# Patient Record
Sex: Male | Born: 1970 | Race: Black or African American | Hispanic: No | Marital: Married | Smoking: Current every day smoker
Health system: Southern US, Community
[De-identification: ages and names within clinical notes are randomized; demographics above are authoritative.]

## PROBLEM LIST (undated history)

## (undated) DIAGNOSIS — I10 Essential (primary) hypertension: Secondary | ICD-10-CM

---

## 2005-02-15 ENCOUNTER — Emergency Department (HOSPITAL_COMMUNITY): Admission: EM | Admit: 2005-02-15 | Discharge: 2005-02-15 | Payer: Self-pay | Admitting: Emergency Medicine

## 2005-02-15 ENCOUNTER — Emergency Department (HOSPITAL_COMMUNITY): Admission: EM | Admit: 2005-02-15 | Discharge: 2005-02-16 | Payer: Self-pay | Admitting: Emergency Medicine

## 2008-06-23 ENCOUNTER — Emergency Department (HOSPITAL_COMMUNITY): Admission: EM | Admit: 2008-06-23 | Discharge: 2008-06-23 | Payer: Self-pay | Admitting: Emergency Medicine

## 2008-08-25 ENCOUNTER — Emergency Department (HOSPITAL_COMMUNITY): Admission: EM | Admit: 2008-08-25 | Discharge: 2008-08-25 | Payer: Self-pay | Admitting: Emergency Medicine

## 2010-11-30 ENCOUNTER — Inpatient Hospital Stay (INDEPENDENT_AMBULATORY_CARE_PROVIDER_SITE_OTHER)
Admission: RE | Admit: 2010-11-30 | Discharge: 2010-11-30 | Disposition: A | Discharge status: Home / Self Care | Payer: Medicare Other | Source: Ambulatory Visit | Attending: Emergency Medicine | Admitting: Emergency Medicine

## 2010-11-30 DIAGNOSIS — R350 Frequency of micturition: Secondary | ICD-10-CM

## 2010-11-30 DIAGNOSIS — L259 Unspecified contact dermatitis, unspecified cause: Secondary | ICD-10-CM

## 2010-11-30 LAB — POCT URINALYSIS DIP (DEVICE)
Hgb urine dipstick: NEGATIVE
Nitrite: NEGATIVE
Protein, ur: NEGATIVE mg/dL
Specific Gravity, Urine: 1.02 (ref 1.005–1.030)
Urobilinogen, UA: 0.2 mg/dL (ref 0.0–1.0)
pH: 6.5 (ref 5.0–8.0)

## 2010-12-31 ENCOUNTER — Emergency Department (HOSPITAL_COMMUNITY)
Admission: EM | Admit: 2010-12-31 | Discharge: 2010-12-31 | Disposition: A | Discharge status: Home / Self Care | Payer: Medicare Other | Attending: Emergency Medicine | Admitting: Emergency Medicine

## 2010-12-31 DIAGNOSIS — R1013 Epigastric pain: Secondary | ICD-10-CM | POA: Insufficient documentation

## 2010-12-31 DIAGNOSIS — Z8659 Personal history of other mental and behavioral disorders: Secondary | ICD-10-CM | POA: Insufficient documentation

## 2010-12-31 DIAGNOSIS — K219 Gastro-esophageal reflux disease without esophagitis: Secondary | ICD-10-CM | POA: Insufficient documentation

## 2010-12-31 LAB — URINALYSIS, ROUTINE W REFLEX MICROSCOPIC
Bilirubin Urine: NEGATIVE
Glucose, UA: NEGATIVE mg/dL
Hgb urine dipstick: NEGATIVE
Ketones, ur: NEGATIVE mg/dL
Protein, ur: NEGATIVE mg/dL
pH: 6.5 (ref 5.0–8.0)

## 2011-04-20 ENCOUNTER — Other Ambulatory Visit (HOSPITAL_COMMUNITY): Payer: Self-pay | Admitting: Internal Medicine

## 2011-04-20 DIAGNOSIS — R1012 Left upper quadrant pain: Secondary | ICD-10-CM

## 2011-04-20 DIAGNOSIS — R1032 Left lower quadrant pain: Secondary | ICD-10-CM

## 2011-04-22 ENCOUNTER — Other Ambulatory Visit (HOSPITAL_COMMUNITY): Payer: Medicare Other

## 2011-04-24 ENCOUNTER — Ambulatory Visit (HOSPITAL_COMMUNITY)
Admission: RE | Admit: 2011-04-24 | Discharge: 2011-04-24 | Disposition: A | Discharge status: Home / Self Care | Payer: Medicare Other | Source: Ambulatory Visit | Attending: Internal Medicine | Admitting: Internal Medicine

## 2011-04-24 DIAGNOSIS — R1032 Left lower quadrant pain: Secondary | ICD-10-CM

## 2011-04-24 DIAGNOSIS — R1012 Left upper quadrant pain: Secondary | ICD-10-CM | POA: Insufficient documentation

## 2011-04-24 DIAGNOSIS — K824 Cholesterolosis of gallbladder: Secondary | ICD-10-CM | POA: Insufficient documentation

## 2011-12-02 ENCOUNTER — Encounter (HOSPITAL_COMMUNITY): Payer: Self-pay | Admitting: *Deleted

## 2011-12-02 ENCOUNTER — Emergency Department (HOSPITAL_COMMUNITY)
Admission: EM | Admit: 2011-12-02 | Discharge: 2011-12-02 | Disposition: A | Discharge status: Home / Self Care | Payer: Medicare Other | Attending: Emergency Medicine | Admitting: Emergency Medicine

## 2011-12-02 DIAGNOSIS — S51009A Unspecified open wound of unspecified elbow, initial encounter: Secondary | ICD-10-CM | POA: Insufficient documentation

## 2011-12-02 DIAGNOSIS — F17200 Nicotine dependence, unspecified, uncomplicated: Secondary | ICD-10-CM | POA: Insufficient documentation

## 2011-12-02 DIAGNOSIS — IMO0002 Reserved for concepts with insufficient information to code with codable children: Secondary | ICD-10-CM

## 2011-12-02 DIAGNOSIS — X58XXXA Exposure to other specified factors, initial encounter: Secondary | ICD-10-CM | POA: Insufficient documentation

## 2011-12-02 MED ORDER — TETANUS-DIPHTH-ACELL PERTUSSIS 5-2.5-18.5 LF-MCG/0.5 IM SUSP
0.5000 mL | Freq: Once | INTRAMUSCULAR | Status: AC
  Administered 2011-12-02: 0.5 mL via INTRAMUSCULAR
  Filled 2011-12-02: qty 0.5

## 2011-12-02 NOTE — Discharge Instructions (Signed)
Laceration Care, Adult A laceration is a cut that goes through all layers of the skin. The cut goes into the tissue beneath the skin. HOME CARE For skin adhesive strips:  Keep the cut clean and dry.   Do not get the strips wet. You may take a bath, but be careful to keep the cut dry.   If the cut gets wet, pat it dry with a clean towel.   The strips will fall off on their own. Do not remove the strips that are still stuck to the cut.    You may need a tetanus shot if:  You cannot remember when you had your last tetanus shot.   You have never had a tetanus shot.  If you need a tetanus shot and you choose not to have one, you may get tetanus. Sickness from tetanus can be serious. GET HELP RIGHT AWAY IF:   Your pain does not get better with medicine.   Your arm, hand, leg, or foot loses feeling (numbness) or changes color.   Your cut is bleeding.   Your joint feels weak, or you cannot use your joint.   You have painful lumps on your body.   Your cut is red, puffy (swollen), or painful.   You have a red line on the skin near the cut.   You have yellowish-white fluid (pus) coming from the cut.   You have a fever.   You have a bad smell coming from the cut or bandage.   Your cut breaks open before or after stitches are removed.   You notice something coming out of the cut, such as wood or glass.   You cannot move a finger or toe.  MAKE SURE YOU:   Understand these instructions.   Will watch your condition.   Will get help right away if you are not doing well or get worse.  Document Released: 01/13/2008 Document Revised: 07/16/2011 Document Reviewed: 01/20/2011 [facility] Patient Information 2012 [place], [place].

## 2011-12-02 NOTE — ED Provider Notes (Signed)
History     CSN: [id number]  Arrival date & time 12/02/11  4098   First MD Initiated Contact with Patient 12/02/11 1037      Chief Complaint  Patient presents with  . Extremity Laceration    (Consider location/radiation/quality/duration/timing/severity/associated sxs/prior treatment) HPI Comments: Patient presents to the emergency department with a chief complaint of right elbow laceration.  Incident occurred 2 days ago.  Patient is not up-to-date on his tetanus.  Patient denies any fevers, night sweats, chills, streaking up the arm, erythema or purulent discharge from laceration.  He denies any pain with flexion or extension of the low.  Laceration is currently not bleeding.  No other complaints at this time.  The history is provided by the patient.    History reviewed. No pertinent past medical history.  History reviewed. No pertinent past surgical history.  No family history on file.  History  Substance Use Topics  . Smoking status: Current Everyday Smoker  . Smokeless tobacco: Not on file  . Alcohol Use: No      Review of Systems  All other systems reviewed and are negative.    Allergies  Penicillins  Home Medications  No current outpatient prescriptions on file.  BP 142/90  Pulse 87  Temp(Src) 97.5 F (36.4 C) (Oral)  Resp 20  SpO2 98%  Physical Exam  Nursing note and vitals reviewed. Constitutional: He is oriented to person, place, and time. He appears well-developed and well-nourished. No distress.  HENT:  Head: Normocephalic and atraumatic.  Eyes: Conjunctivae and EOM are normal.  Neck: Normal range of motion.  Pulmonary/Chest: Effort normal.  Musculoskeletal: Normal range of motion.  Neurological: He is alert and oriented to person, place, and time.  Skin: Skin is warm and dry. No rash noted. He is not diaphoretic.       3 cm laceration over the posterior right elbow.  Not currently bleeding.  Psychiatric: He has a normal mood and affect. His  behavior is normal.    ED Course  Procedures (including critical care time)  Labs Reviewed - No data to display No results found.   No diagnosis found.    MDM  Laceration  Tdap booster given.Pressure irrigation performed. Laceration occurred > 8 hours ago therefore a repair is not indicated.. Pt has no co morbidities to effect normal wound healing. Discussed home care w pt and answered questions. Pt to f-u for wound check 7 days and has been advised to return to the emergency department if symptoms of infection present including fever, night sweats, chills, purulent discharge, red streaking up the arm, surrounding tenderness or erythema. Pt is hemodynamically stable w no complaints prior to dc.           Jaci Carrel, [place] 12/02/11 1146

## 2011-12-02 NOTE — ED Notes (Signed)
Patient has a cut to his right elbow,  He states he needs stitches.  Patient states the accident occurred 2 days ago

## 2011-12-05 NOTE — ED Provider Notes (Signed)
Medical screening examination/treatment/procedure(s) were performed by non-physician practitioner and as supervising physician I was immediately available for consultation/collaboration.  Alika Saladin R. Shantale Holtmeyer, MD 12/05/11 0701 

## 2012-05-31 ENCOUNTER — Encounter (HOSPITAL_COMMUNITY): Payer: Self-pay

## 2012-05-31 ENCOUNTER — Emergency Department (HOSPITAL_COMMUNITY)
Admission: EM | Admit: 2012-05-31 | Discharge: 2012-05-31 | Disposition: A | Discharge status: Home / Self Care | Payer: Medicare Other | Attending: Emergency Medicine | Admitting: Emergency Medicine

## 2012-05-31 DIAGNOSIS — Z87891 Personal history of nicotine dependence: Secondary | ICD-10-CM | POA: Insufficient documentation

## 2012-05-31 DIAGNOSIS — J069 Acute upper respiratory infection, unspecified: Secondary | ICD-10-CM | POA: Insufficient documentation

## 2012-05-31 DIAGNOSIS — I10 Essential (primary) hypertension: Secondary | ICD-10-CM | POA: Insufficient documentation

## 2012-05-31 HISTORY — DX: Essential (primary) hypertension: I10

## 2012-05-31 MED ORDER — SODIUM CHLORIDE 0.65 % NA SOLN: 1.0000 | NASAL | Status: DC | PRN

## 2012-05-31 MED ORDER — BENZONATATE 100 MG PO CAPS: 200.0000 mg | ORAL_CAPSULE | Freq: Two times a day (BID) | ORAL | Status: DC | PRN

## 2012-05-31 NOTE — ED Notes (Signed)
Pt with c/o stuffy head, sore throat, cough with green phlegm for 1 week

## 2012-05-31 NOTE — ED Provider Notes (Signed)
History     CSN: [id number]  Arrival date & time 05/31/12  4098   First MD Initiated Contact with Patient 05/31/12 0802      Chief Complaint  Patient presents with  . URI    (Consider location/radiation/quality/duration/timing/severity/associated sxs/prior treatment) HPI Comments: Dennis Campbell presents for evaluation of URI-like symptoms over the last week.  He reports sinus drainage and mild sinus pressure.  It is associated with an occasional earache, a "scratchy" throat, and watery eyes.  He also reports having a cough.  He denies fever, facial pain, chest pain, or difficulty breathing.  He also denies NVD.  Patient is a 41 y.o. male presenting with URI. The history is provided by the patient. No language interpreter was used.  URI The primary symptoms include fatigue, ear pain (very mild discomfort) and cough. Primary symptoms do not include fever, headaches, sore throat, swollen glands, wheezing, abdominal pain, nausea, vomiting, myalgias, arthralgias or rash. The current episode started 6 to 7 days ago. This is a new problem. The problem has not changed since onset. Symptoms associated with the illness include plugged ear sensation, congestion and rhinorrhea. The illness is not associated with chills, facial pain or sinus pressure. The following treatments were addressed: A decongestant was ineffective. NSAIDs were ineffective.    Past Medical History  Diagnosis Date  . Hypertension     History reviewed. No pertinent past surgical history.  History reviewed. No pertinent family history.  History  Substance Use Topics  . Smoking status: Current Every Day Smoker  . Smokeless tobacco: Not on file  . Alcohol Use: No      Review of Systems  Constitutional: Positive for fatigue. Negative for fever and chills.  HENT: Positive for ear pain (very mild discomfort), congestion and rhinorrhea. Negative for sore throat and sinus pressure.   Respiratory: Positive for cough. Negative  for wheezing.   Gastrointestinal: Negative for nausea, vomiting and abdominal pain.  Musculoskeletal: Negative for myalgias and arthralgias.  Skin: Negative for rash.  Neurological: Negative for headaches.  All other systems reviewed and are negative.    Allergies  Penicillins  Home Medications   Current Outpatient Rx  Name Route Sig Dispense Refill  . GUAIFENESIN ER 600 MG PO TB12 Oral Take 600 mg by mouth 2 (two) times daily.    . IBUPROFEN 200 MG PO TABS Oral Take 400 mg by mouth every 8 (eight) hours as needed. For pain    . THERAFLU COLD & COUGH PO Oral Take 1 tablet by mouth daily.    Marland Kitchen PRESCRIPTION MEDICATION Oral Take 1 tablet by mouth daily. Blood Pressure medication      BP 143/97  Pulse 80  Temp 98.3 F (36.8 C) (Oral)  Resp 22  SpO2 98%  Physical Exam  Nursing note and vitals reviewed. Constitutional: He is oriented to person, place, and time. He appears well-developed and well-nourished. No distress.  HENT:  Head: Normocephalic and atraumatic. Head is without raccoon's eyes, without Battle's sign, without laceration, without right periorbital erythema and without left periorbital erythema. No trismus in the jaw.  Right Ear: Tympanic membrane, external ear and ear canal normal. No mastoid tenderness. Tympanic membrane is not perforated and not bulging.  Left Ear: Tympanic membrane, external ear and ear canal normal. No mastoid tenderness. Tympanic membrane is not perforated and not bulging.  Nose: Mucosal edema and rhinorrhea present. No sinus tenderness or septal deviation. No epistaxis. Right sinus exhibits no maxillary sinus tenderness and no frontal sinus tenderness.  Left sinus exhibits no maxillary sinus tenderness and no frontal sinus tenderness.  Mouth/Throat: Uvula is midline, oropharynx is clear and moist and mucous membranes are normal. Mucous membranes are not pale, not dry and not cyanotic. He does not have dentures. No oral lesions. Abnormal dentition.  Dental caries present. No uvula swelling or lacerations. No oropharyngeal exudate, posterior oropharyngeal edema, posterior oropharyngeal erythema or tonsillar abscesses.  Eyes: Conjunctivae normal and EOM are normal. Pupils are equal, round, and reactive to light. Right eye exhibits no discharge and no exudate. Left eye exhibits no discharge and no exudate. Right conjunctiva is not injected. Right conjunctiva has no hemorrhage. Left conjunctiva is not injected. Left conjunctiva has no hemorrhage. No scleral icterus. Right eye exhibits normal extraocular motion and no nystagmus. Left eye exhibits normal extraocular motion and no nystagmus. Pupils are equal.  Neck: Normal range of motion. Neck supple. No JVD present. No spinous process tenderness and no muscular tenderness present. No rigidity. No tracheal deviation, no erythema and normal range of motion present.  Cardiovascular: Normal rate, regular rhythm, normal heart sounds and intact distal pulses.  Exam reveals no gallop and no friction rub.   No murmur heard. Pulmonary/Chest: Breath sounds normal. No stridor. No respiratory distress. He has no wheezes. He has no rales. He exhibits no tenderness.  Abdominal: Soft. Bowel sounds are normal. He exhibits no distension and no mass. There is no tenderness. There is no rebound and no guarding.  Musculoskeletal: Normal range of motion. He exhibits no edema and no tenderness.  Lymphadenopathy:    He has no cervical adenopathy.  Neurological: He is alert and oriented to person, place, and time. No cranial nerve deficit.  Skin: Skin is warm and dry. No rash noted. He is not diaphoretic. No erythema. No pallor.  Psychiatric: He has a normal mood and affect. His behavior is normal.    ED Course  Procedures (including critical care time)  Labs Reviewed - No data to display No results found.   No diagnosis found.    MDM  Pt presents for evaluation of sinus drainage and URI-like symptoms for a week.   He does not have facial and sinus tenderness or purulent drainage to suggest bacterial sinusitis.  Ears are clear.  There is no evidence of a pharyngitis and he has no meningismus.  Lung sounds are clear and he is afebrile and appears nontoxic.  His hx and exam strongly suggest a viral process.  Plan symptomatic care.        Tobin Chad, MD 05/31/12 1023

## 2012-05-31 NOTE — ED Notes (Signed)
MD at bedside. 

## 2012-08-22 IMAGING — US US ABDOMEN COMPLETE
1 series · 14 of 25 positions shown · non-contrast
Comparison: None

CLINICAL DATA: Left upper quadrant abdominal pain.

COMPLETE ABDOMINAL ULTRASOUND

[Series 1: us abdomen complete · 0.28mm/px · 14 of 76 slices shown]
[im 1/76]
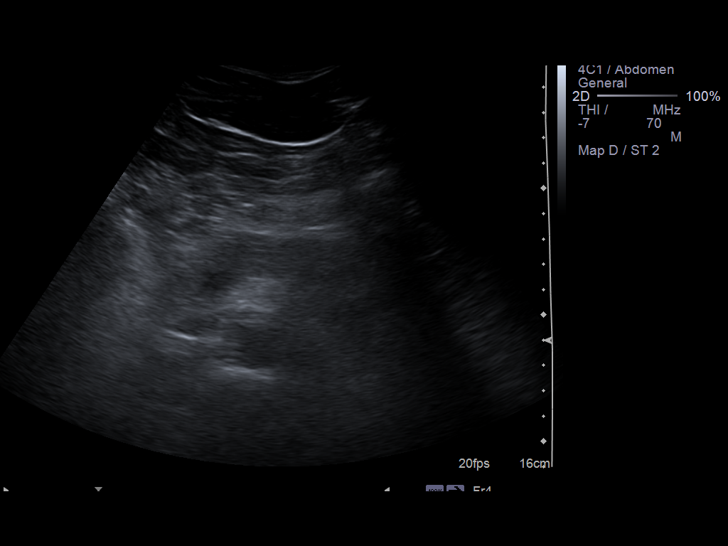
[im 7/76]
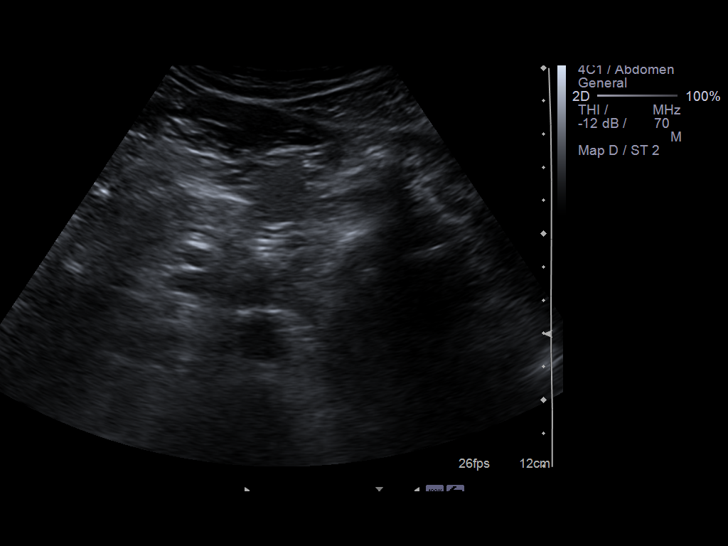
[im 13/76]
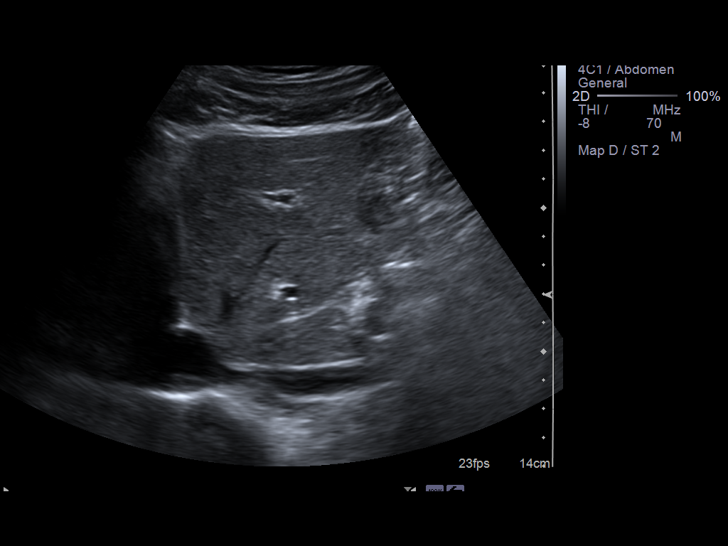
[im 19/76]
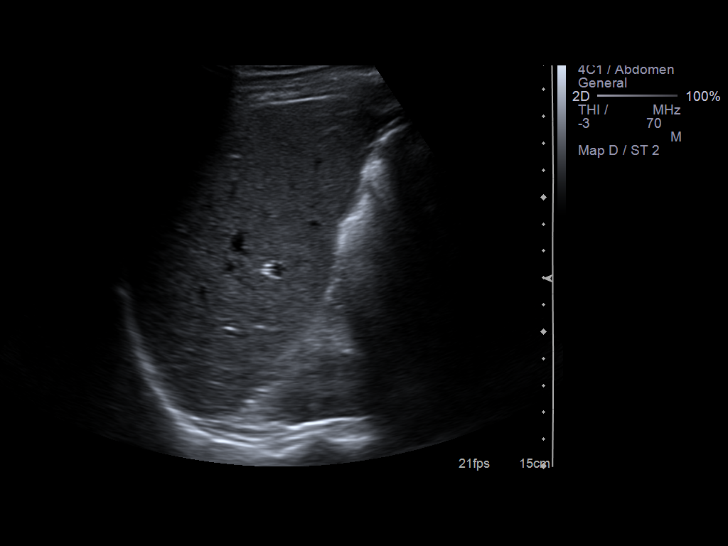
[im 26/76]
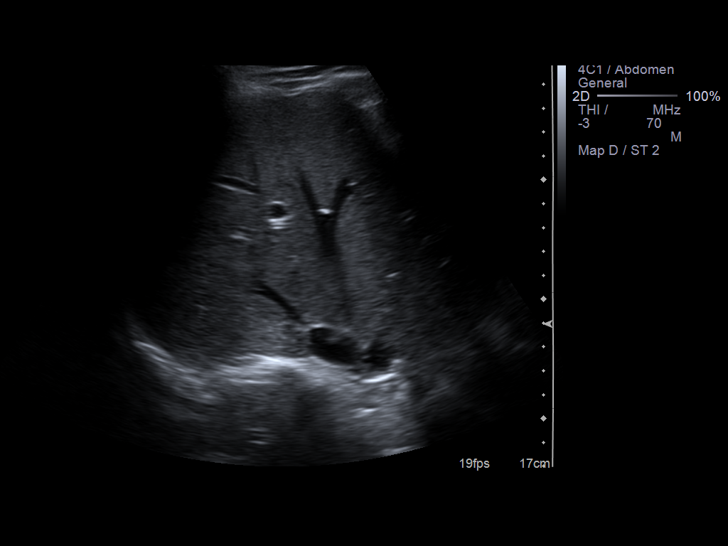
[im 29/76]
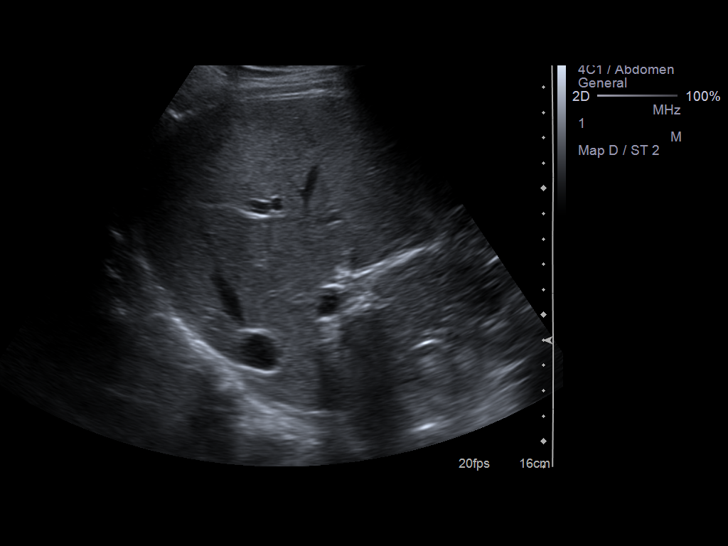
[im 35/76]
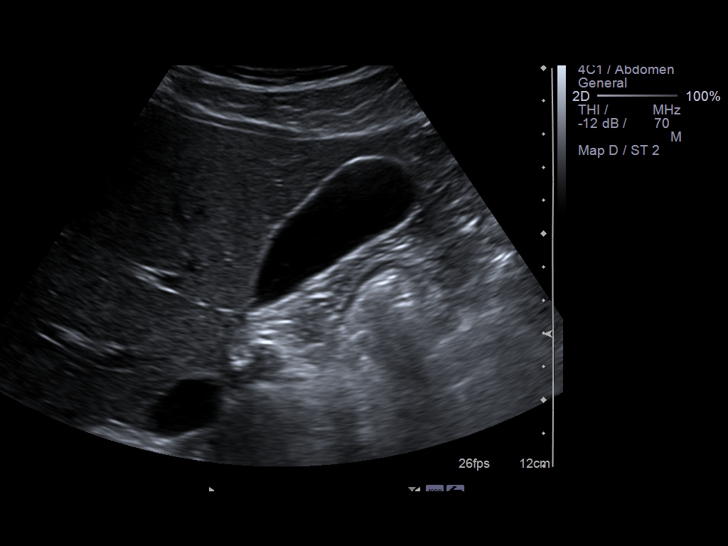
[im 41/76]
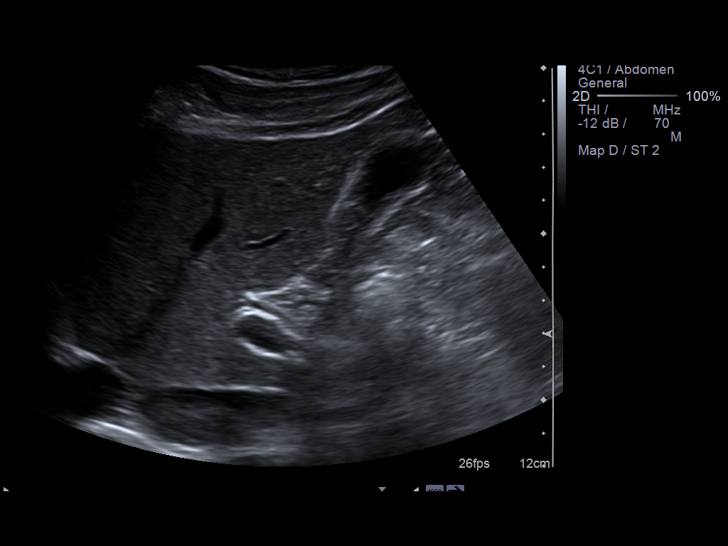
[im 47/76]
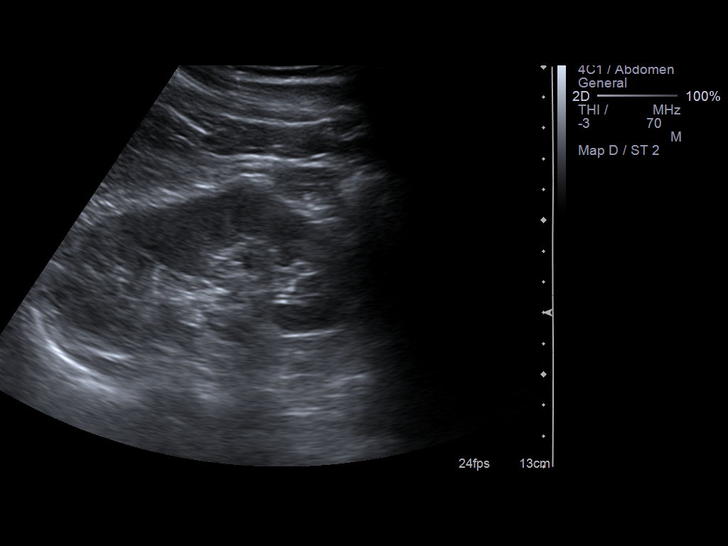
[im 51/76]
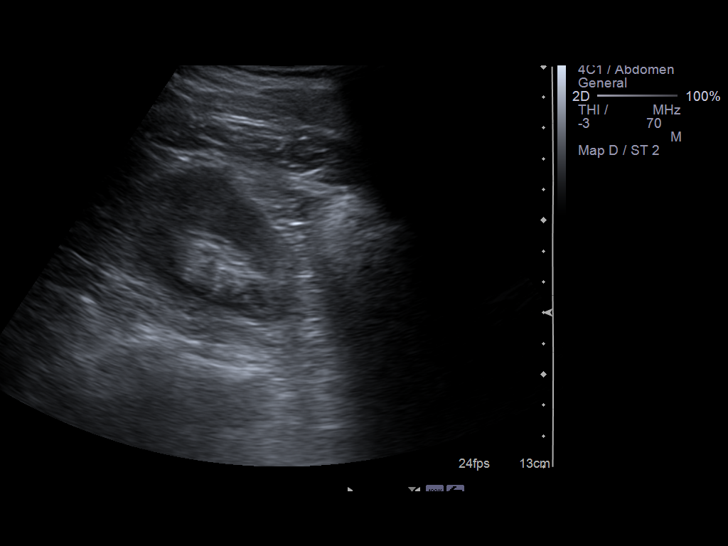
[im 57/76]
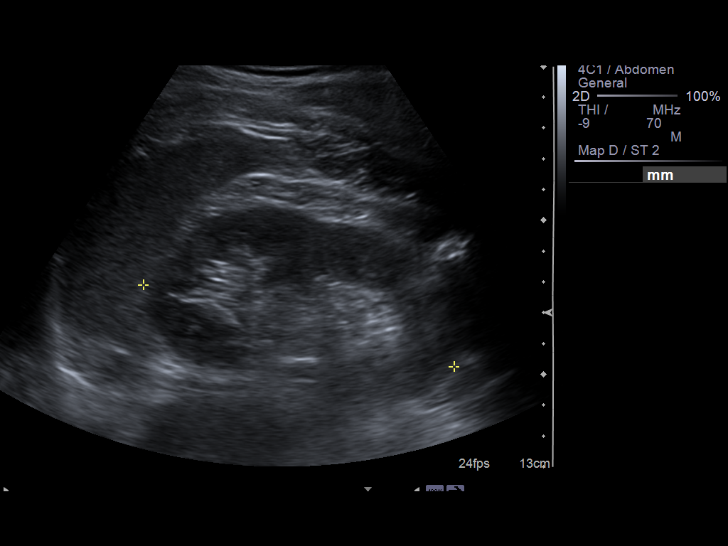
[im 63/76]
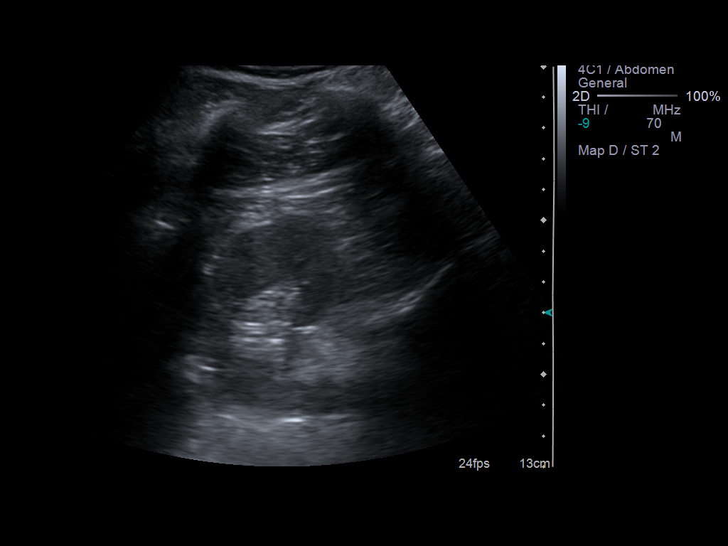
[im 69/76]
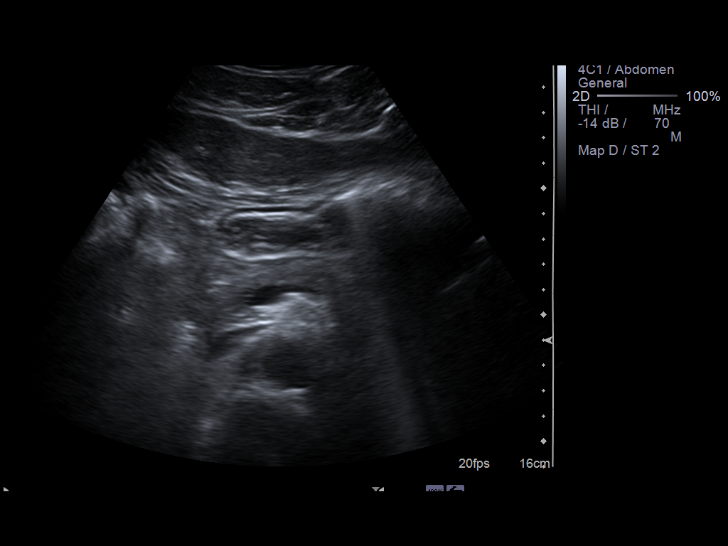
[im 76/76]
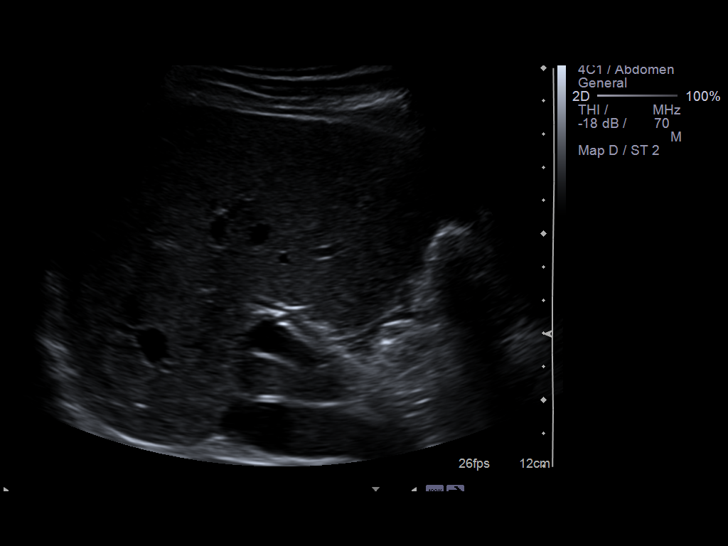

[14 of 25 positions shown; findings below may reference images not displayed]

FINDINGS: Gallbladder: Well distended without wall thickening, stones or
pericholecystic fluid. There is a 3 mm gallbladder polyp.  There is
no sonographic Murphy's sign.

Common bile duct:   Normal in caliber without filling defects.

Liver:  Echogenicity is within normal limits.  No focal hepatic
abnormalities are identified.

IVC:  Visualized portions appear unremarkable.

Pancreas:  Visualized portions appear unremarkable.

Spleen:  Visualized portions appear unremarkable.

Right Kidney:  Slight contour irregularity of the lower interpolar
region is consistent with a cortical lobule. The renal cortical
thickness and echogenicity are preserved.  There is no
hydronephrosis or focal abnormality. Renal length is 10.8 cm.

Left Kidney:   The renal cortical thickness and echogenicity are
preserved.  There is no hydronephrosis or focal abnormality. Renal
length is 10.8 cm.

Abdominal aorta:  Visualized portions appear unremarkable.
IMPRESSION: No acute or significant abdominal findings identified.  Tiny
gallbladder polyp.

## 2013-05-24 ENCOUNTER — Encounter (HOSPITAL_COMMUNITY): Payer: Self-pay | Admitting: Emergency Medicine

## 2013-05-24 ENCOUNTER — Emergency Department (HOSPITAL_COMMUNITY): Payer: Medicare Other

## 2013-05-24 ENCOUNTER — Emergency Department (HOSPITAL_COMMUNITY)
Admission: EM | Admit: 2013-05-24 | Discharge: 2013-05-24 | Disposition: A | Discharge status: Home / Self Care | Payer: Medicare Other | Attending: Emergency Medicine | Admitting: Emergency Medicine

## 2013-05-24 DIAGNOSIS — I10 Essential (primary) hypertension: Secondary | ICD-10-CM | POA: Insufficient documentation

## 2013-05-24 DIAGNOSIS — Z79899 Other long term (current) drug therapy: Secondary | ICD-10-CM | POA: Insufficient documentation

## 2013-05-24 DIAGNOSIS — R071 Chest pain on breathing: Secondary | ICD-10-CM | POA: Insufficient documentation

## 2013-05-24 DIAGNOSIS — IMO0001 Reserved for inherently not codable concepts without codable children: Secondary | ICD-10-CM | POA: Insufficient documentation

## 2013-05-24 DIAGNOSIS — Z880 Allergy status to penicillin: Secondary | ICD-10-CM | POA: Insufficient documentation

## 2013-05-24 DIAGNOSIS — F17200 Nicotine dependence, unspecified, uncomplicated: Secondary | ICD-10-CM | POA: Insufficient documentation

## 2013-05-24 DIAGNOSIS — R0789 Other chest pain: Secondary | ICD-10-CM

## 2013-05-24 DIAGNOSIS — M7918 Myalgia, other site: Secondary | ICD-10-CM

## 2013-05-24 MED ORDER — HYDROCODONE-ACETAMINOPHEN 5-325 MG PO TABS: ORAL_TABLET | ORAL | Status: AC

## 2013-05-24 MED ORDER — IBUPROFEN 800 MG PO TABS
800.0000 mg | ORAL_TABLET | Freq: Once | ORAL | Status: AC
  Administered 2013-05-24: 800 mg via ORAL
  Filled 2013-05-24: qty 1

## 2013-05-24 NOTE — ED Provider Notes (Signed)
Medical screening examination/treatment/procedure(s) were performed by non-physician practitioner and as supervising physician I was immediately available for consultation/collaboration.  ECG interpretation   Date: 05/24/2013  Rate: 65  Rhythm: normal sinus rhythm  QRS Axis: normal  Intervals: normal  ST/T Wave abnormalities: normal  Conduction Disutrbances: none  Narrative Interpretation:   Old EKG Reviewed: Prior EKG available     Lyanne Co, MD 05/24/13 1710

## 2013-05-24 NOTE — ED Notes (Signed)
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
## 2013-05-26 NOTE — ED Provider Notes (Signed)
Medical screening examination/treatment/procedure(s) were performed by non-physician practitioner and as supervising physician I was immediately available for consultation/collaboration.   Lyanne Co, MD 05/26/13 [phone]

## 2014-09-22 IMAGING — CR DG CHEST 2V
2 series · 2 of 2 positions shown · non-contrast
Comparison: None.

CLINICAL DATA: Hypertension. Smoker with right-sided chest pain for
3 days. Short of breath.

EXAM:
CHEST  2 VIEW

[w chest pa]
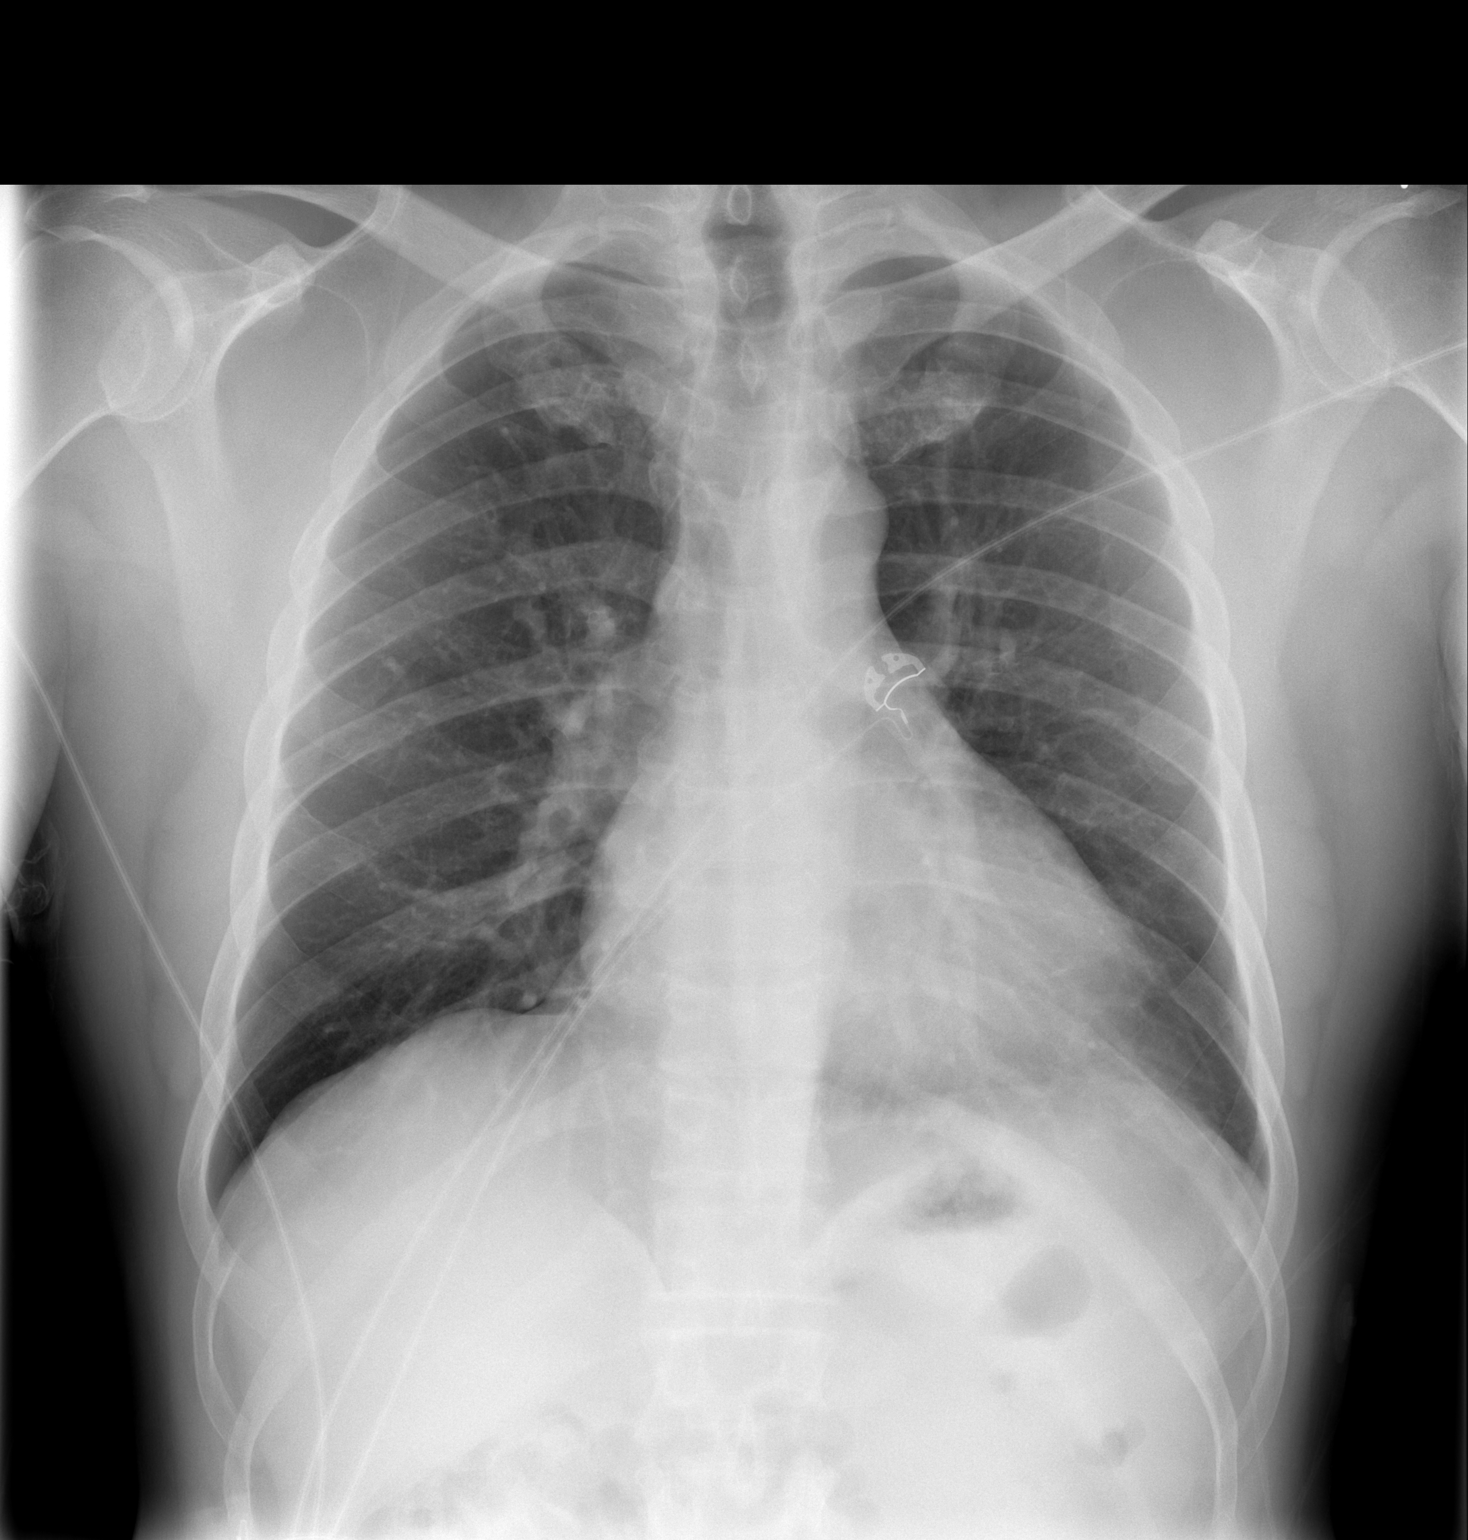

[w chest lat]
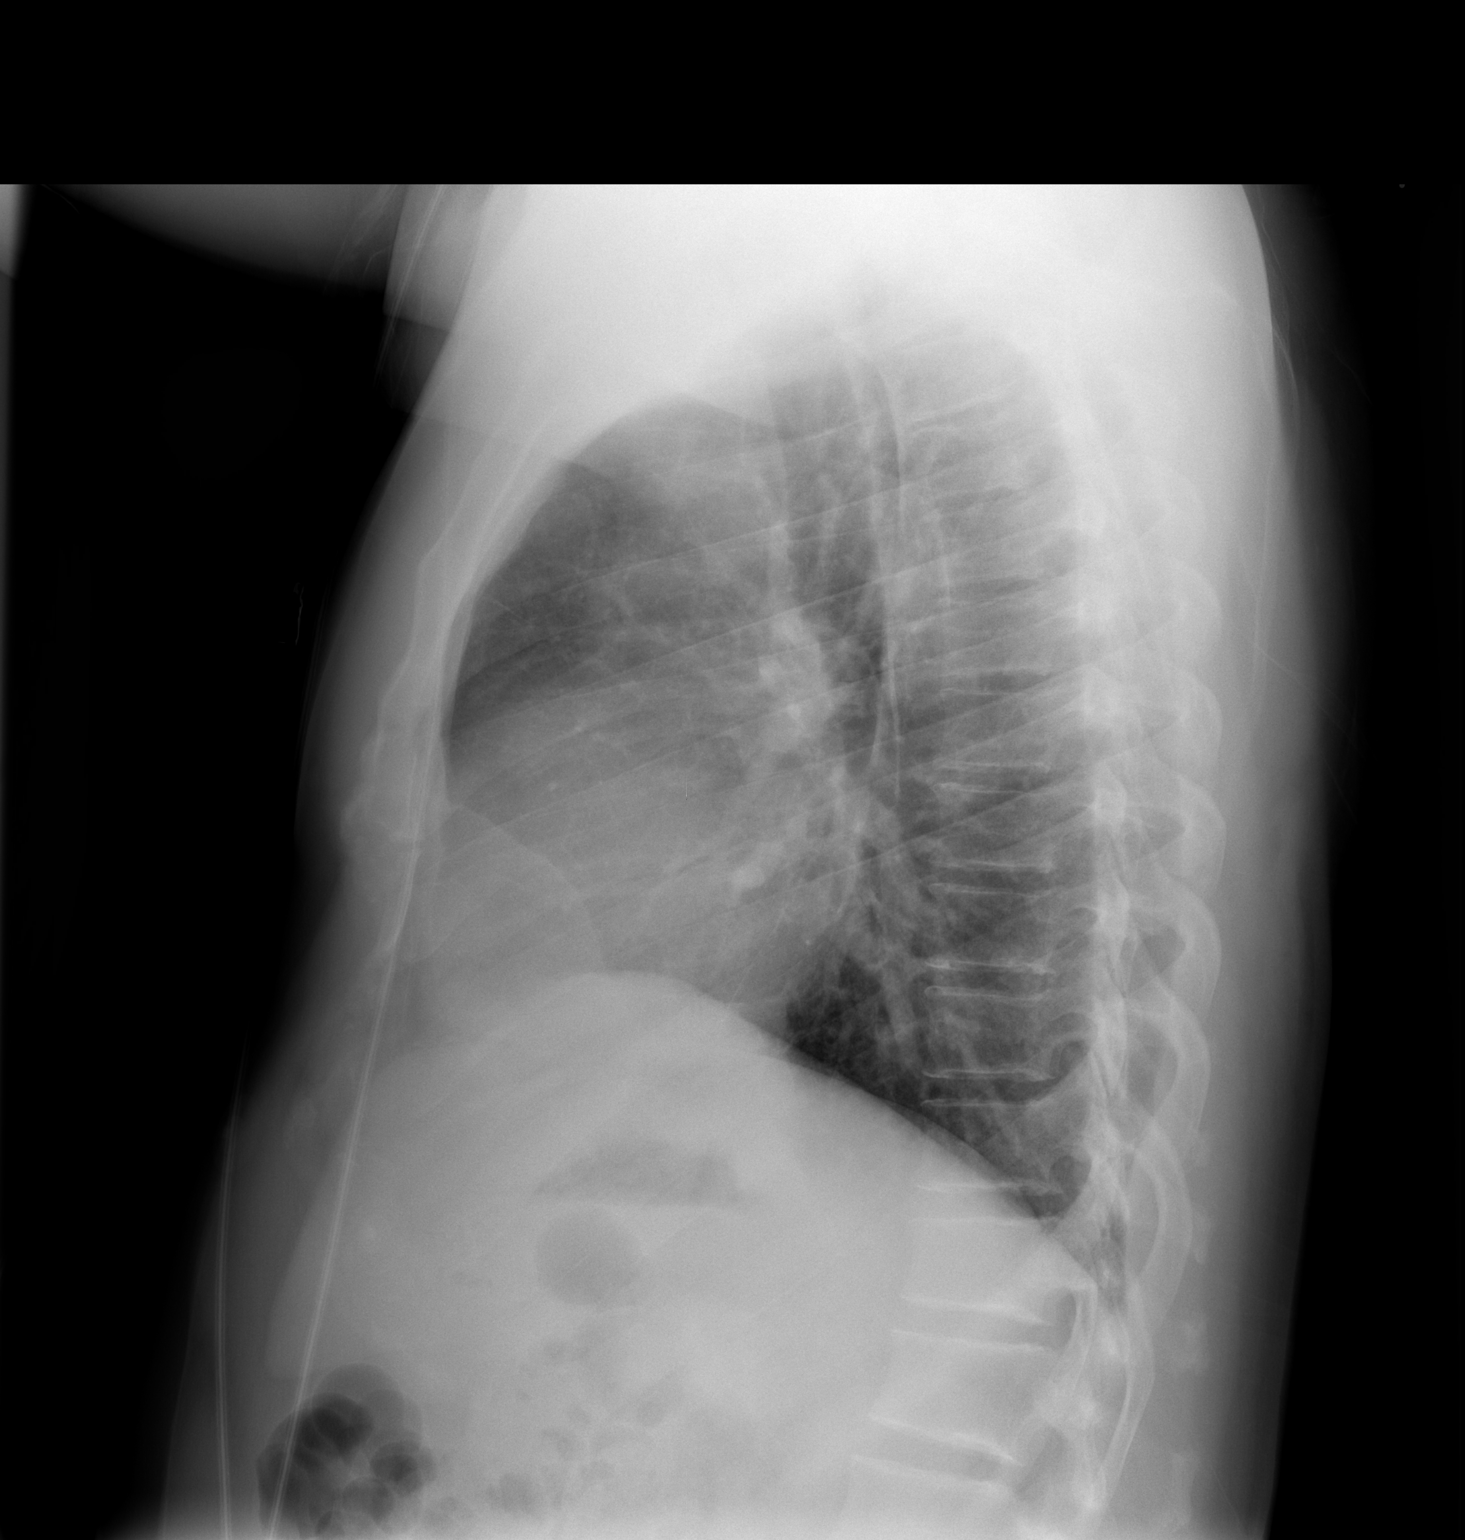

[2 of 2 positions shown; findings below may reference images not displayed]

FINDINGS: Midline trachea. Normal heart size and mediastinal contours. No
pleural effusion or pneumothorax. Clear lungs.
IMPRESSION: No acute cardiopulmonary disease.

## 2018-09-09 ENCOUNTER — Other Ambulatory Visit: Payer: Self-pay

## 2018-09-09 ENCOUNTER — Encounter (HOSPITAL_COMMUNITY): Payer: Self-pay | Admitting: Emergency Medicine

## 2018-09-09 ENCOUNTER — Ambulatory Visit (HOSPITAL_COMMUNITY)
Admission: EM | Admit: 2018-09-09 | Discharge: 2018-09-09 | Disposition: A | Discharge status: Home / Self Care | Payer: Medicare Other | Attending: Family Medicine | Admitting: Family Medicine

## 2018-09-09 DIAGNOSIS — L03116 Cellulitis of left lower limb: Secondary | ICD-10-CM | POA: Diagnosis not present

## 2018-09-09 MED ORDER — PREDNISONE 5 MG PO TABS: ORAL_TABLET | ORAL | 0 refills | Status: DC

## 2018-09-09 MED ORDER — METHYLPREDNISOLONE ACETATE 80 MG/ML IJ SUSP: 80.0000 mg | Freq: Once | INTRAMUSCULAR | Status: DC

## 2018-09-09 MED ORDER — DOXYCYCLINE HYCLATE 100 MG PO TABS
100.0000 mg | ORAL_TABLET | Freq: Two times a day (BID) | ORAL | 0 refills | Status: AC
Start: 2018-09-09 — End: 2018-09-16

## 2018-09-09 NOTE — ED Provider Notes (Signed)
[facility]    CSN: [id number] Arrival date & time: 09/09/18  1810     History   Chief Complaint Chief Complaint  Patient presents with  . Rash    HPI Dennis Campbell is a 48 y.o. male.   He is presenting with a papular rash that is covering his whole body.  He initially started having pain redness and swelling in the left lower extremity.  Since that time this rash has developed.  It is pruritic in nature.  He has been taking Benadryl with no improvement of the rash but does have improvement with the itching.  He denies any fevers.  He does have some oozing from his left lower medial tibia.  HPI  Past Medical History:  Diagnosis Date  . Hypertension     There are no active problems to display for this patient.   History reviewed. No pertinent surgical history.     Home Medications    Prior to Admission medications   Medication Sig Start Date End Date Taking? Authorizing Provider  amLODipine (NORVASC) 10 MG tablet Take 10 mg by mouth daily. 04/24/13  Yes [provider]  doxycycline (VIBRA-TABS) 100 MG tablet Take 1 tablet (100 mg total) by mouth 2 (two) times daily for 7 days. 09/09/18 09/16/18  Myra Rude, MD  HYDROcodone-acetaminophen (NORCO/VICODIN) 5-325 MG per tablet Take 1-2 pills every 4-6 hours as needed for pain. 05/24/13   Lurene Shadow, PA-C  ibuprofen (ADVIL,MOTRIN) 200 MG tablet Take 400 mg by mouth every 6 (six) hours as needed for pain.    [provider]  predniSONE (DELTASONE) 5 MG tablet Take 6 pills for first day, 5 pills second day, 4 pills third day, 3 pills fourth day, 2 pills the fifth day, and 1 pill sixth day. 09/09/18   Myra Rude, MD    Family History No family history on file.  Social History Social History   Tobacco Use  . Smoking status: Current Every Day Smoker  Substance Use Topics  . Alcohol use: No  . Drug use: No     Allergies   Penicillins   Review of Systems Review of Systems    Constitutional: Negative for fatigue.  HENT: Negative for congestion.   Respiratory: Negative for stridor.   Cardiovascular: Negative for chest pain.  Gastrointestinal: Negative for abdominal pain.  Musculoskeletal: Negative for gait problem.  Skin: Positive for rash.  Neurological: Negative for weakness.  Hematological: Negative for adenopathy.     Physical Exam Triage Vital Signs ED Triage Vitals  Enc Vitals Group     BP 09/09/18 1843 (!) 152/93     Pulse Rate 09/09/18 1842 85     Resp 09/09/18 1842 16     Temp 09/09/18 1842 99.9 F (37.7 C)     Temp Source 09/09/18 1842 Temporal     SpO2 09/09/18 1842 98 %     Weight --      Height --      Head Circumference --      Peak Flow --      Pain Score 09/09/18 1842 3     Pain Loc --      Pain Edu? --      Excl. in GC? --    No data found.  Updated Vital Signs BP (!) 152/93   Pulse 85   Temp 99.9 F (37.7 C) (Temporal)   Resp 16   SpO2 98%   Visual Acuity Right Eye  Distance:   Left Eye Distance:   Bilateral Distance:    Right Eye Near:   Left Eye Near:    Bilateral Near:     Physical Exam Gen: NAD, alert, cooperative with exam,  ENT: normal lips, normal nasal mucosa,  Eye: normal EOM, normal conjunctiva and lids CV:  no edema, +2 pedal pulses   Resp: no accessory muscle use, non-labored,  Skin: Generalized maculopapular rash occurring his whole body, left lower extremity with some induration and erythema and drainage. Neuro: normal tone, normal sensation to touch Psych:  normal insight, alert and oriented MSK: Normal gait, normal strength           UC Treatments / Results  Labs (all labs ordered are listed, but only abnormal results are displayed) Labs Reviewed - No data to display  EKG None  Radiology No results found.  Procedures Procedures (including critical care time)  Medications Ordered in UC Medications - No data to display  Initial Impression / Assessment and Plan / UC  Course  I have reviewed the triage vital signs and the nursing notes.  Pertinent labs & imaging results that were available during my care of the patient were reviewed by me and considered in my medical decision making (see chart for details).     [name] is a 48 year old male that is presenting with rash and findings likely suggestive of cellulitis of left lower extremity.  Less likely for the rash to be associated with urticaria and more likely with the cellulitis.  Not febrile here but appears that his symmetry may be had in that direction.  He looks well on exam and feels well.  Provided doxycycline and prednisone.  Counseled on these use.  Counseled on supportive care.  Given indications to return to follow-up.  Final Clinical Impressions(s) / UC Diagnoses   Final diagnoses:  Cellulitis of left lower extremity     Discharge Instructions     Please take the antibiotic first. See how your leg does. Please follow up if it isn't improving  You can take the steroid after you finish the antibiotics.  Please follow up sooner if your symptoms get worse.     ED Prescriptions    Medication Sig Dispense Auth. Provider   doxycycline (VIBRA-TABS) 100 MG tablet Take 1 tablet (100 mg total) by mouth 2 (two) times daily for 7 days. 14 tablet Myra Rude, MD   predniSONE (DELTASONE) 5 MG tablet Take 6 pills for first day, 5 pills second day, 4 pills third day, 3 pills fourth day, 2 pills the fifth day, and 1 pill sixth day. 21 tablet Myra Rude, MD     Controlled Substance Prescriptions [place] Controlled Substance Registry consulted? Not Applicable   Myra Rude, MD 09/09/18 2105

## 2018-09-09 NOTE — ED Triage Notes (Signed)
PT has a generalized rash for 1 week. PT recently changed his washing detergent.

## 2018-09-09 NOTE — ED Notes (Signed)
Patient able to ambulate independently  

## 2018-09-09 NOTE — Discharge Instructions (Signed)
Please take the antibiotic first. See how your leg does. Please follow up if it isn't improving  You can take the steroid after you finish the antibiotics.  Please follow up sooner if your symptoms get worse.

## 2018-09-11 ENCOUNTER — Ambulatory Visit (HOSPITAL_COMMUNITY)
Admission: EM | Admit: 2018-09-11 | Discharge: 2018-09-11 | Disposition: A | Discharge status: Home / Self Care | Payer: Medicare Other | Attending: Family Medicine | Admitting: Family Medicine

## 2018-09-11 ENCOUNTER — Encounter (HOSPITAL_COMMUNITY): Payer: Self-pay | Admitting: Emergency Medicine

## 2018-09-11 DIAGNOSIS — L03116 Cellulitis of left lower limb: Secondary | ICD-10-CM | POA: Diagnosis not present

## 2018-09-11 DIAGNOSIS — R21 Rash and other nonspecific skin eruption: Secondary | ICD-10-CM | POA: Diagnosis not present

## 2018-09-11 DIAGNOSIS — I10 Essential (primary) hypertension: Secondary | ICD-10-CM | POA: Diagnosis not present

## 2018-09-11 MED ORDER — METHYLPREDNISOLONE ACETATE 80 MG/ML IJ SUSP
80.0000 mg | Freq: Once | INTRAMUSCULAR | Status: AC
  Administered 2018-09-11: 80 mg via INTRAMUSCULAR

## 2018-09-11 MED ORDER — HYDROXYZINE HCL 25 MG PO TABS: 25.0000 mg | ORAL_TABLET | Freq: Every evening | ORAL | 0 refills | Status: DC | PRN

## 2018-09-11 MED ORDER — METHYLPREDNISOLONE ACETATE 80 MG/ML IJ SUSP
INTRAMUSCULAR | Status: AC
  Filled 2018-09-11: qty 1

## 2018-09-11 NOTE — ED Triage Notes (Signed)
Pt states he had a rash on his arms x1 week and he was seen here he was prescribed two medicines doxy and prednisone but was unable to pick up the prednisone and states the doxy didn't help.

## 2018-09-11 NOTE — Discharge Instructions (Signed)
Depo-medrol shot given in office Continue with doxycycline as prescribed.   Hydroxyzine at night for itching.  DO NOT TAKE this mediation while driving or operating heavy machinery.   You may use an OTC zyrtec/ allegra/ or claritin during the day for daytime relief You may follow up with PCP or community health if symptoms persists Return or go to the ER if you have any new or worsening symptoms such as fever, chills, nausea, vomiting, worsening redness, increased swelling, discharge, if symptoms do not improve with medications, etc...Marland Kitchen

## 2018-09-11 NOTE — ED Provider Notes (Signed)
[facility]   [id number] 09/11/18 Arrival Time: 1001  CC: Rash  SUBJECTIVE:  Dennis Campbell is a 48 y.o. male who presents with ongoing rash for the past week.  [facility] and treated for cellulitis with doxycycline and prednisone.  Was unable to pick up prednisone. Pt reports much improvement in cellulitis of LLE, but other rash still persists.   Denies precipitating event or trauma.  Denies changes in soaps, detergents, close contacts with similar rash, known trigger or environmental trigger, allergy. Denies medications change or starting a new medication recently.  Rash is diffuse about the body.  Describes it as itchy.  Has tried benadryl without relief.  Symptoms are made worse with itching.  Denies similar symptoms in the past.   Denies fever, chills, nausea, vomiting, erythema, swelling, discharge, oral lesions, SOB, chest pain, abdominal pain, changes in bowel or bladder function.    ROS: As per HPI.  Past Medical History:  Diagnosis Date  . Hypertension    History reviewed. No pertinent surgical history. Allergies  Allergen Reactions  . Penicillins Nausea And Vomiting   No current facility-administered medications on file prior to encounter.    Current Outpatient Medications on File Prior to Encounter  Medication Sig Dispense Refill  . amLODipine (NORVASC) 10 MG tablet Take 10 mg by mouth daily.    Marland Kitchen. doxycycline (VIBRA-TABS) 100 MG tablet Take 1 tablet (100 mg total) by mouth 2 (two) times daily for 7 days. 14 tablet 0  . HYDROcodone-acetaminophen (NORCO/VICODIN) 5-325 MG per tablet Take 1-2 pills every 4-6 hours as needed for pain. 6 tablet 0  . ibuprofen (ADVIL,MOTRIN) 200 MG tablet Take 400 mg by mouth every 6 (six) hours as needed for pain.     Social History   Socioeconomic History  . Marital status: Married    Spouse name: Not on file  . Number of children: Not on file  . Years of education: Not on file  . Highest education level: Not on  file  Occupational History  . Not on file  Social Needs  . Financial resource strain: Not on file  . Food insecurity:    Worry: Not on file    Inability: Not on file  . Transportation needs:    Medical: Not on file    Non-medical: Not on file  Tobacco Use  . Smoking status: Current Every Day Smoker  Substance and Sexual Activity  . Alcohol use: No  . Drug use: No  . Sexual activity: Not on file  Lifestyle  . Physical activity:    Days per week: Not on file    Minutes per session: Not on file  . Stress: Not on file  Relationships  . Social connections:    Talks on phone: Not on file    Gets together: Not on file    Attends religious service: Not on file    Active member of club or organization: Not on file    Attends meetings of clubs or organizations: Not on file    Relationship status: Not on file  . Intimate partner violence:    Fear of current or ex partner: Not on file    Emotionally abused: Not on file    Physically abused: Not on file    Forced sexual activity: Not on file  Other Topics Concern  . Not on file  Social History Narrative  . Not on file   No family history on file.  OBJECTIVE: Vitals:  09/11/18 1010  BP: (!) 154/95  Pulse: 93  Resp: 18  Temp: (!) 97.5 F (36.4 C)  SpO2: 97%    General appearance: alert; no distress Head: NCAT Lungs: Normal respirations Extremities: no edema Skin: warm and dry; fine maculopapular rash diffuse about the body, NTTP, no obvious drainage; LLE with mild erythema and swelling, mildly TTP, no obvious drainage (see pictures below) Psychological: alert and cooperative; normal mood and affect              ASSESSMENT & PLAN:  1. Rash and nonspecific skin eruption   2. Cellulitis of left lower extremity     Meds ordered this encounter  Medications  . methylPREDNISolone acetate (DEPO-MEDROL) injection 80 mg  . hydrOXYzine (ATARAX/VISTARIL) 25 MG tablet    Sig: Take 1 tablet (25 mg total) by mouth  at bedtime as needed for itching.    Dispense:  10 tablet    Refill:  0    Order Specific Question:   Supervising Provider    Answer:   Eustace MooreELSON, YVONNE SUE [[id number]]    Depo-medrol shot given in office Continue with doxycycline as prescribed.   Hydroxyzine at night for itching.  DO NOT TAKE this mediation while driving or operating heavy machinery.   You may use an OTC zyrtec/ allegra/ or claritin during the day for daytime relief You may follow up with PCP or community health if symptoms persists Return or go to the ER if you have any new or worsening symptoms such as fever, chills, nausea, vomiting, worsening redness, increased swelling, discharge, if symptoms do not improve with medications, etc...  Reviewed expectations re: course of current medical issues. Questions answered. Outlined signs and symptoms indicating need for more acute intervention. Patient verbalized understanding. After Visit Summary given.   Rennis HardingWurst, Kebrina Friend, PA-C 09/11/18 [phone]

## 2018-09-13 ENCOUNTER — Emergency Department (HOSPITAL_COMMUNITY)
Admission: EM | Admit: 2018-09-13 | Discharge: 2018-09-13 | Disposition: A | Discharge status: Home / Self Care | Payer: Medicare Other | Attending: Emergency Medicine | Admitting: Emergency Medicine

## 2018-09-13 ENCOUNTER — Encounter (HOSPITAL_COMMUNITY): Payer: Self-pay

## 2018-09-13 DIAGNOSIS — Z79899 Other long term (current) drug therapy: Secondary | ICD-10-CM | POA: Insufficient documentation

## 2018-09-13 DIAGNOSIS — F17210 Nicotine dependence, cigarettes, uncomplicated: Secondary | ICD-10-CM | POA: Insufficient documentation

## 2018-09-13 DIAGNOSIS — R21 Rash and other nonspecific skin eruption: Secondary | ICD-10-CM | POA: Diagnosis not present

## 2018-09-13 DIAGNOSIS — I10 Essential (primary) hypertension: Secondary | ICD-10-CM | POA: Diagnosis not present

## 2018-09-13 MED ORDER — DEXAMETHASONE SODIUM PHOSPHATE 10 MG/ML IJ SOLN
10.0000 mg | Freq: Once | INTRAMUSCULAR | Status: AC
  Administered 2018-09-13: 10 mg via INTRAMUSCULAR
  Filled 2018-09-13: qty 1

## 2018-09-13 MED ORDER — PREDNISONE 20 MG PO TABS: 40.0000 mg | ORAL_TABLET | Freq: Every day | ORAL | 0 refills | Status: AC

## 2018-09-13 NOTE — Discharge Instructions (Signed)
Your rash appears to be consistent with a dermatitis which is a type of allergy.  You will need to take the medications exactly as prescribed including prednisone, 40 mg a day for 5 days as well as the hydroxyzine which you were given at your prior visit if you are out of that medication you may take Benadryl.  Seek medical exam for fevers or worsening of the rash.

## 2018-09-13 NOTE — ED Provider Notes (Signed)
[place] [facility] Provider Note   CSN: [id number] Arrival date & time: 09/13/18  1556     History   Chief Complaint Chief Complaint  Patient presents with  . Rash    HPI Dennis Campbell is a 48 y.o. male.  HPI  The patient is a 48 year old male, he is very pleasant, he presents to the hospital today with a complaint of a rash that covers nearly the entirety of his body.  This rash can be traced back to approximately January 30.  He started to have the rash with itching at that time, he had some associated swelling and redness of his left lower extremity and was treated with doxycycline at the urgent care which she stated improved his redness however the rash that he has on his face, chest, back, arms, legs sparing the palms the groin and areas around the neck has not improved whatsoever.  It continues to stay the same.  He then visited the doctor again 2 days later and was seen by a physician assistant, prescribed Vistaril and given a Depo-Medrol shot (he never got the prednisone filled from the first visit on 31 January).  He reports that he has been taking the medication Vistaril, he feels like things are not getting any better.  I have gone through a very thorough history of possible topical exposures, new medications, new pets, new environmental allergies and there does not seem to be anything specific.  He denies taking any new foods, any travel and his significant other who he lives with does not have the same rash.  He reports that there is no lesions in his mouth, no problems with his eyes, no difficulty with urination or stools and he is not having fevers.  He has not seen a dermatologist.  Past Medical History:  Diagnosis Date  . Hypertension     There are no active problems to display for this patient.   No past surgical history on file.      Home Medications    Prior to Admission medications   Medication Sig Start Date End Date  Taking? Authorizing Provider  amLODipine (NORVASC) 10 MG tablet Take 10 mg by mouth daily. 04/24/13   [provider]  doxycycline (VIBRA-TABS) 100 MG tablet Take 1 tablet (100 mg total) by mouth 2 (two) times daily for 7 days. 09/09/18 09/16/18  Rosemarie Ax, MD  HYDROcodone-acetaminophen (NORCO/VICODIN) 5-325 MG per tablet Take 1-2 pills every 4-6 hours as needed for pain. 05/24/13   Noe Gens, PA-C  hydrOXYzine (ATARAX/VISTARIL) 25 MG tablet Take 1 tablet (25 mg total) by mouth at bedtime as needed for itching. 09/11/18   Wurst, [place], PA-C  ibuprofen (ADVIL,MOTRIN) 200 MG tablet Take 400 mg by mouth every 6 (six) hours as needed for pain.    [provider]  predniSONE (DELTASONE) 20 MG tablet Take 2 tablets (40 mg total) by mouth daily. 09/13/18   Noemi Chapel, MD    Family History No family history on file.  Social History Social History   Tobacco Use  . Smoking status: Current Every Day Smoker  Substance Use Topics  . Alcohol use: No  . Drug use: No     Allergies   Penicillins   Review of Systems Review of Systems  All other systems reviewed and are negative.    Physical Exam Updated Vital Signs BP 116/71 (BP Location: Left Arm)   Pulse (!) 112   Temp 98.8 F (37.1  C) (Oral)   Resp 18   SpO2 100%   Physical Exam Vitals signs and nursing note reviewed.  Constitutional:      General: He is not in acute distress.    Appearance: He is well-developed.  HENT:     Head: Normocephalic and atraumatic.     Mouth/Throat:     Pharynx: No oropharyngeal exudate.  Eyes:     General: No scleral icterus.       Right eye: No discharge.        Left eye: No discharge.     Conjunctiva/sclera: Conjunctivae normal.     Pupils: Pupils are equal, round, and reactive to light.  Neck:     Musculoskeletal: Normal range of motion and neck supple.     Thyroid: No thyromegaly.     Vascular: No JVD.  Cardiovascular:     Rate and Rhythm: Normal rate and  regular rhythm.     Heart sounds: Normal heart sounds. No murmur. No friction rub. No gallop.   Pulmonary:     Effort: Pulmonary effort is normal. No respiratory distress.     Breath sounds: Normal breath sounds. No wheezing or rales.  Abdominal:     General: Bowel sounds are normal. There is no distension.     Palpations: Abdomen is soft. There is no mass.     Tenderness: There is no abdominal tenderness.  Musculoskeletal: Normal range of motion.        General: No tenderness.  Lymphadenopathy:     Cervical: No cervical adenopathy.  Skin:    General: Skin is warm and dry.     Findings: Rash present. No erythema.     Comments: The patient has a diffuse papular rash involving his entire body but sparing the periorbital areas, the oral mucosa, around the neck and the genitourinary area, it does not involve the palms or the soles.  It is on the flexor and extensor extremities as well as the trunk both anterior and posterior.  There are a couple of excoriations but no signs of vesicles, no pustules, no petechiae or purpura.  Neurological:     Mental Status: He is alert.     Coordination: Coordination normal.  Psychiatric:        Behavior: Behavior normal.      ED Treatments / Results  Labs (all labs ordered are listed, but only abnormal results are displayed) Labs Reviewed - No data to display  EKG None  Radiology No results found.  Procedures Procedures (including critical care time)  Medications Ordered in ED Medications  dexamethasone (DECADRON) injection 10 mg (has no administration in time range)     Initial Impression / Assessment and Plan / ED Course  I have reviewed the triage vital signs and the nursing notes.  Pertinent labs & imaging results that were available during my care of the patient were reviewed by me and considered in my medical decision making (see chart for details).    The rash seems consistent with a diffuse papular eruption which is likely a  dermatitis.  The patient does not appear acutely ill, this does not appear consistent with erythema multiforme or Stevens-Johnson syndrome and does not appear consistent with a secondary syphilis.  He will be prescribed a prednisone course, I have referred the patient to a dermatologist.  He is aware that it is incumbent upon him to follow-up with a dermatologist.  Based on the pictures that I have seen in the prior chart and his  examination today there is absolutely no change in his rash either for the better or for the worse.  Final Clinical Impressions(s) / ED Diagnoses   Final diagnoses:  Papular rash, generalized    ED Discharge Orders         Ordered    predniSONE (DELTASONE) 20 MG tablet  Daily     09/13/18 1751           Noemi Chapel, MD 09/13/18 1753

## 2018-09-13 NOTE — ED Triage Notes (Signed)
Pt reports worsening rash all over his body, [facility], has been given abx and steriods but the rash has gotten worse. Unknown what the allergy is from. resp e/u, lungs clear, maintaining secretions. nad noted.

## 2018-09-25 ENCOUNTER — Other Ambulatory Visit: Payer: Self-pay

## 2018-09-25 ENCOUNTER — Emergency Department (HOSPITAL_COMMUNITY)
Admission: EM | Admit: 2018-09-25 | Discharge: 2018-09-25 | Disposition: A | Discharge status: Home / Self Care | Payer: Medicare Other | Attending: Emergency Medicine | Admitting: Emergency Medicine

## 2018-09-25 DIAGNOSIS — F17200 Nicotine dependence, unspecified, uncomplicated: Secondary | ICD-10-CM | POA: Diagnosis not present

## 2018-09-25 DIAGNOSIS — Z79899 Other long term (current) drug therapy: Secondary | ICD-10-CM | POA: Diagnosis not present

## 2018-09-25 DIAGNOSIS — I10 Essential (primary) hypertension: Secondary | ICD-10-CM | POA: Diagnosis not present

## 2018-09-25 DIAGNOSIS — L309 Dermatitis, unspecified: Secondary | ICD-10-CM

## 2018-09-25 DIAGNOSIS — L259 Unspecified contact dermatitis, unspecified cause: Secondary | ICD-10-CM | POA: Diagnosis not present

## 2018-09-25 DIAGNOSIS — R21 Rash and other nonspecific skin eruption: Secondary | ICD-10-CM | POA: Diagnosis present

## 2018-09-25 MED ORDER — TRIAMCINOLONE ACETONIDE 0.1 % EX CREA: 1.0000 "application " | TOPICAL_CREAM | Freq: Two times a day (BID) | CUTANEOUS | 0 refills | Status: AC

## 2018-09-25 MED ORDER — DEXAMETHASONE SODIUM PHOSPHATE 10 MG/ML IJ SOLN
10.0000 mg | Freq: Once | INTRAMUSCULAR | Status: AC
  Administered 2018-09-25: 10 mg via INTRAMUSCULAR
  Filled 2018-09-25: qty 1

## 2018-09-25 MED ORDER — HYDROXYZINE HCL 25 MG PO TABS: 25.0000 mg | ORAL_TABLET | Freq: Three times a day (TID) | ORAL | 0 refills | Status: AC | PRN

## 2018-09-25 NOTE — ED Provider Notes (Signed)
[facility] EMERGENCY DEPARTMENT Provider Note   CSN: [id number] Arrival date & time: 09/25/18  1452     History   Chief Complaint Chief Complaint  Patient presents with  . Rash    HPI Dennis Campbell is a 48 y.o. male.  Patient is a 48 year old male who presents with a rash.  He has had a rash for the last couple weeks.  Is a diffuse itchy rash.  He is not sure what has led to a rash.  He does not have any new exposures.  He was seen in urgent care and got a shot of a steroid.  He was seen back in the emergency room and got a prescription for oral steroids and Vistaril.  He said that the shot worked pretty effectively but then the rash came back.  He says is very itchy and burning at times.  He has no shortness of breath.  No airway involvement.  No lip or mouth swelling.  No history of allergic reactions in the past.  He has tried to get in touch with a dermatologist that he was referred to from the emergency room and was not able to get an appointment until June or July.  He was told by the office to come back to the emergency room for treatment.     Past Medical History:  Diagnosis Date  . Hypertension     There are no active problems to display for this patient.   No past surgical history on file.      Home Medications    Prior to Admission medications   Medication Sig Start Date End Date Taking? Authorizing Provider  amLODipine (NORVASC) 10 MG tablet Take 10 mg by mouth daily. 04/24/13   [provider]  HYDROcodone-acetaminophen (NORCO/VICODIN) 5-325 MG per tablet Take 1-2 pills every 4-6 hours as needed for pain. 05/24/13   Lurene Shadow, PA-C  hydrOXYzine (ATARAX/VISTARIL) 25 MG tablet Take 1 tablet (25 mg total) by mouth every 8 (eight) hours as needed for itching. 09/25/18   Rolan Bucco, MD  ibuprofen (ADVIL,MOTRIN) 200 MG tablet Take 400 mg by mouth every 6 (six) hours as needed for pain.    [provider]  predniSONE  (DELTASONE) 20 MG tablet Take 2 tablets (40 mg total) by mouth daily. 09/13/18   Eber Hong, MD  triamcinolone cream (KENALOG) 0.1 % Apply 1 application topically 2 (two) times daily. 09/25/18   Rolan Bucco, MD    Family History No family history on file.  Social History Social History   Tobacco Use  . Smoking status: Current Every Day Smoker  Substance Use Topics  . Alcohol use: No  . Drug use: No     Allergies   Penicillins   Review of Systems Review of Systems  Constitutional: Negative for chills, diaphoresis, fatigue and fever.  HENT: Negative for congestion, rhinorrhea and sneezing.   Eyes: Negative.   Respiratory: Negative for cough, chest tightness and shortness of breath.   Cardiovascular: Negative for chest pain and leg swelling.  Gastrointestinal: Negative for abdominal pain, blood in stool, diarrhea, nausea and vomiting.  Genitourinary: Negative for difficulty urinating, flank pain, frequency and hematuria.  Musculoskeletal: Negative for arthralgias and back pain.  Skin: Positive for rash.  Neurological: Negative for dizziness, speech difficulty, weakness, numbness and headaches.     Physical Exam Updated Vital Signs BP 119/82 (BP Location: Right Arm)   Pulse 97   Temp 98.7 F (37.1 C) (Oral)  Resp 16   Ht 5\' 7"  (1.702 m)   Wt 90.7 kg   SpO2 99%   BMI 31.32 kg/m   Physical Exam Constitutional:      Appearance: He is well-developed.  HENT:     Head: Normocephalic and atraumatic.  Eyes:     Pupils: Pupils are equal, round, and reactive to light.  Neck:     Musculoskeletal: Normal range of motion and neck supple.  Cardiovascular:     Rate and Rhythm: Normal rate and regular rhythm.     Heart sounds: Normal heart sounds.  Pulmonary:     Effort: Pulmonary effort is normal. No respiratory distress.     Breath sounds: Normal breath sounds. No wheezing or rales.  Chest:     Chest wall: No tenderness.  Abdominal:     General: Bowel sounds are  normal.     Palpations: Abdomen is soft.     Tenderness: There is no abdominal tenderness. There is no guarding or rebound.  Musculoskeletal: Normal range of motion.  Lymphadenopathy:     Cervical: No cervical adenopathy.  Skin:    General: Skin is warm and dry.     Findings: Rash present.     Comments: Diffuse maculopapular type rash to his trunk and extremities.  It is blanching.  There is no petechiae or purpura.  No vesicles.  There is some associated dry skin patches.  There is no rash on his palms.  No oral lesions.  Neurological:     Mental Status: He is alert and oriented to person, place, and time.      ED Treatments / Results  Labs (all labs ordered are listed, but only abnormal results are displayed) Labs Reviewed - No data to display  EKG None  Radiology No results found.  Procedures Procedures (including critical care time)  Medications Ordered in ED Medications  dexamethasone (DECADRON) injection 10 mg (has no administration in time range)     Initial Impression / Assessment and Plan / ED Course  I have reviewed the triage vital signs and the nursing notes.  Pertinent labs & imaging results that were available during my care of the patient were reviewed by me and considered in my medical decision making (see chart for details).     Patient has a diffuse rash.  It looks allergic in nature or some type of dermatitis.  It does not look like scabies.  There is no suggestions of Stevens-Johnson's or other concerning type rashes.  He was given a shot of Decadron in the ED.  I also given prescription for triamcinolone cream and advised him to apply this at night covered by [employer].  He was given different dermatology offices in the area for potential follow-up.  Return precautions were given.  Final Clinical Impressions(s) / ED Diagnoses   Final diagnoses:  Dermatitis    ED Discharge Orders         Ordered    hydrOXYzine (ATARAX/VISTARIL) 25 MG tablet   Every 8 hours PRN     09/25/18 1526    triamcinolone cream (KENALOG) 0.1 %  2 times daily     09/25/18 1526           Rolan Bucco, MD 09/25/18 1530

## 2018-09-25 NOTE — ED Triage Notes (Signed)
Pt here for evaluation of itchy and painful rash all over his body with recent hx of visit for same. Sts he can't get into a dermatologist until June and rash has returned since medication given at last visit.

## 2019-08-17 ENCOUNTER — Encounter (HOSPITAL_COMMUNITY): Payer: Self-pay | Admitting: Emergency Medicine

## 2019-08-17 ENCOUNTER — Other Ambulatory Visit: Payer: Self-pay

## 2019-08-17 ENCOUNTER — Ambulatory Visit (HOSPITAL_COMMUNITY)
Admission: EM | Admit: 2019-08-17 | Discharge: 2019-08-17 | Disposition: A | Discharge status: Home / Self Care | Payer: Medicare Other | Attending: Family Medicine | Admitting: Family Medicine

## 2019-08-17 DIAGNOSIS — Z20822 Contact with and (suspected) exposure to covid-19: Secondary | ICD-10-CM | POA: Insufficient documentation

## 2019-08-17 NOTE — ED Triage Notes (Signed)
Pt and his wife were exposed to a Covid + person on Sunday.  Direct exposure is unclear.  Pt is asymptomatic.

## 2019-08-17 NOTE — ED Provider Notes (Signed)
[place]   [id number] 08/17/19 Arrival Time: 4098  ASSESSMENT & PLAN:  1. Exposure to COVID-19 virus      COVID-19 testing sent. See work note for self-isolation instructions.  Follow-up Information    [name]   Specialty: Urgent Care Why: As needed. Contact information: [place] [phone]          Reviewed expectations re: course of current medical issues. Questions answered. Outlined signs and symptoms indicating need for more acute intervention. Patient verbalized understanding. After Visit Summary given.   SUBJECTIVE: History from: patient. Dennis Campbell is a 49 y.o. male who requests COVID-19 testing. Known COVID-19 contact: reports being exposed several days ago. Recent travel: none. Denies: runny nose, congestion, fever, cough, sore throat, difficulty breathing and headache. Normal PO intake without n/v/d.  ROS: As per HPI.   OBJECTIVE:  Vitals:   08/17/19 1026  BP: 127/72  Pulse: 86  Resp: 19  Temp: 97.8 F (36.6 C)  TempSrc: Oral  SpO2: 97%    General appearance: alert; no distress Eyes: PERRLA; EOMI; conjunctiva normal HENT: [place]; AT Neck: supple  Lungs: speaks full sentences without difficulty; unlabored Extremities: no edema Skin: warm and dry Neurologic: normal gait Psychological: alert and cooperative; normal mood and affect  Labs:  Labs Reviewed  NOVEL CORONAVIRUS, NAA (HOSP ORDER, SEND-OUT TO REF LAB; TAT 18-24 HRS)    Allergies  Allergen Reactions  . Penicillins Nausea And Vomiting    Past Medical History:  Diagnosis Date  . Hypertension    Social History   Socioeconomic History  . Marital status: Married    Spouse name: Not on file  . Number of children: Not on file  . Years of education: Not on file  . Highest education level: Not on file  Occupational History  . Not on file  Tobacco Use  . Smoking status: Current  Every Day Smoker  . Smokeless tobacco: Never Used  Substance and Sexual Activity  . Alcohol use: No  . Drug use: No  . Sexual activity: Not on file  Other Topics Concern  . Not on file  Social History Narrative  . Not on file   Social Determinants of Health   Financial Resource Strain:   . Difficulty of Paying Living Expenses: Not on file  Food Insecurity:   . Worried About Charity fundraiser in the Last Year: Not on file  . Ran Out of Food in the Last Year: Not on file  Transportation Needs:   . Lack of Transportation (Medical): Not on file  . Lack of Transportation (Non-Medical): Not on file  Physical Activity:   . Days of Exercise per Week: Not on file  . Minutes of Exercise per Session: Not on file  Stress:   . Feeling of Stress : Not on file  Social Connections:   . Frequency of Communication with Friends and Family: Not on file  . Frequency of Social Gatherings with Friends and Family: Not on file  . Attends Religious Services: Not on file  . Active Member of Clubs or Organizations: Not on file  . Attends Archivist Meetings: Not on file  . Marital Status: Not on file  Intimate Partner Violence:   . Fear of Current or Ex-Partner: Not on file  . Emotionally Abused: Not on file  . Physically Abused: Not on file  . Sexually Abused: Not on file   Family History  Problem Relation Age of Onset  . Healthy Mother   . Healthy Father    History reviewed. No pertinent surgical history.   Mardella Layman, MD 08/17/19 1036

## 2019-08-17 NOTE — Discharge Instructions (Signed)
You have been tested for COVID-19 today. If your test is positive, you will receive a phone call from [facility] regarding your results. Negative test results are not called. Both positive and negative results area always visible on MyChart. If you do not have a MyChart account, sign up instructions are in your discharge papers.  

## 2019-08-19 LAB — NOVEL CORONAVIRUS, NAA (HOSP ORDER, SEND-OUT TO REF LAB; TAT 18-24 HRS): SARS-CoV-2, NAA: NOT DETECTED

## 2022-03-13 DIAGNOSIS — Z880 Allergy status to penicillin: Secondary | ICD-10-CM | POA: Diagnosis not present

## 2022-03-13 DIAGNOSIS — N400 Enlarged prostate without lower urinary tract symptoms: Secondary | ICD-10-CM | POA: Diagnosis not present

## 2022-03-13 DIAGNOSIS — Z008 Encounter for other general examination: Secondary | ICD-10-CM | POA: Diagnosis not present

## 2022-03-13 DIAGNOSIS — Z87891 Personal history of nicotine dependence: Secondary | ICD-10-CM | POA: Diagnosis not present

## 2022-03-13 DIAGNOSIS — I10 Essential (primary) hypertension: Secondary | ICD-10-CM | POA: Diagnosis not present

## 2022-03-13 DIAGNOSIS — Z833 Family history of diabetes mellitus: Secondary | ICD-10-CM | POA: Diagnosis not present

## 2022-03-13 DIAGNOSIS — Z6835 Body mass index (BMI) 35.0-35.9, adult: Secondary | ICD-10-CM | POA: Diagnosis not present

## 2022-03-13 DIAGNOSIS — Z791 Long term (current) use of non-steroidal anti-inflammatories (NSAID): Secondary | ICD-10-CM | POA: Diagnosis not present

## 2022-03-25 DIAGNOSIS — H5203 Hypermetropia, bilateral: Secondary | ICD-10-CM | POA: Diagnosis not present

## 2022-03-25 DIAGNOSIS — H524 Presbyopia: Secondary | ICD-10-CM | POA: Diagnosis not present

## 2022-08-12 DIAGNOSIS — E669 Obesity, unspecified: Secondary | ICD-10-CM | POA: Diagnosis not present

## 2022-08-12 DIAGNOSIS — Z6834 Body mass index (BMI) 34.0-34.9, adult: Secondary | ICD-10-CM | POA: Diagnosis not present

## 2022-08-12 DIAGNOSIS — Z87891 Personal history of nicotine dependence: Secondary | ICD-10-CM | POA: Diagnosis not present

## 2022-08-12 DIAGNOSIS — N400 Enlarged prostate without lower urinary tract symptoms: Secondary | ICD-10-CM | POA: Diagnosis not present

## 2022-08-12 DIAGNOSIS — I10 Essential (primary) hypertension: Secondary | ICD-10-CM | POA: Diagnosis not present

## 2022-08-12 DIAGNOSIS — Z008 Encounter for other general examination: Secondary | ICD-10-CM | POA: Diagnosis not present

## 2022-08-12 DIAGNOSIS — Z8249 Family history of ischemic heart disease and other diseases of the circulatory system: Secondary | ICD-10-CM | POA: Diagnosis not present

## 2022-08-12 DIAGNOSIS — Z880 Allergy status to penicillin: Secondary | ICD-10-CM | POA: Diagnosis not present

## 2022-08-12 DIAGNOSIS — J309 Allergic rhinitis, unspecified: Secondary | ICD-10-CM | POA: Diagnosis not present

## 2022-10-07 DIAGNOSIS — R059 Cough, unspecified: Secondary | ICD-10-CM | POA: Diagnosis not present

## 2022-10-07 DIAGNOSIS — J45909 Unspecified asthma, uncomplicated: Secondary | ICD-10-CM | POA: Diagnosis not present

## 2024-04-04 ENCOUNTER — Other Ambulatory Visit: Payer: Self-pay | Admitting: Nurse Practitioner

## 2024-04-04 DIAGNOSIS — N5089 Other specified disorders of the male genital organs: Secondary | ICD-10-CM
